# Patient Record
Sex: Female | Born: 1945 | Race: White | Hispanic: No | Marital: Married | Smoking: Former smoker
Health system: Southern US, Community
[De-identification: ages and names within clinical notes are randomized; demographics above are authoritative.]

## PROBLEM LIST (undated history)

## (undated) DIAGNOSIS — I10 Essential (primary) hypertension: Secondary | ICD-10-CM

## (undated) DIAGNOSIS — L219 Seborrheic dermatitis, unspecified: Secondary | ICD-10-CM

## (undated) DIAGNOSIS — M8580 Other specified disorders of bone density and structure, unspecified site: Secondary | ICD-10-CM

## (undated) DIAGNOSIS — E042 Nontoxic multinodular goiter: Secondary | ICD-10-CM

## (undated) HISTORY — DX: Essential (primary) hypertension: I10

## (undated) HISTORY — DX: Other specified disorders of bone density and structure, unspecified site: M85.80

## (undated) HISTORY — DX: Seborrheic dermatitis, unspecified: L21.9

## (undated) HISTORY — DX: Nontoxic multinodular goiter: E04.2

## (undated) HISTORY — PX: CATARACT EXTRACTION: SUR2

---

## 1974-04-16 HISTORY — PX: TUMOR REMOVAL: SHX12

## 1999-04-17 HISTORY — PX: BACK SURGERY: SHX140

## 2000-09-05 ENCOUNTER — Encounter: Admission: RE | Admit: 2000-09-05 | Discharge: 2000-09-05 | Payer: Self-pay | Admitting: Family Medicine

## 2000-09-05 ENCOUNTER — Encounter: Payer: Self-pay | Admitting: Family Medicine

## 2000-09-16 ENCOUNTER — Encounter: Payer: Self-pay | Admitting: Neurological Surgery

## 2000-09-16 ENCOUNTER — Ambulatory Visit (HOSPITAL_COMMUNITY): Admission: RE | Admit: 2000-09-16 | Discharge: 2000-09-17 | Payer: Self-pay | Admitting: Neurological Surgery

## 2001-07-15 ENCOUNTER — Encounter: Admission: RE | Admit: 2001-07-15 | Discharge: 2001-10-13 | Payer: Self-pay | Admitting: Neurological Surgery

## 2002-10-20 ENCOUNTER — Encounter: Payer: Self-pay | Admitting: *Deleted

## 2002-10-20 ENCOUNTER — Encounter: Admission: RE | Admit: 2002-10-20 | Discharge: 2002-10-20 | Payer: Self-pay | Admitting: *Deleted

## 2003-09-27 ENCOUNTER — Other Ambulatory Visit: Admission: RE | Admit: 2003-09-27 | Discharge: 2003-09-27 | Payer: Self-pay | Admitting: *Deleted

## 2003-12-30 ENCOUNTER — Encounter: Admission: RE | Admit: 2003-12-30 | Discharge: 2003-12-30 | Payer: Self-pay | Admitting: *Deleted

## 2005-04-16 HISTORY — PX: LASIK: SHX215

## 2005-12-13 ENCOUNTER — Other Ambulatory Visit: Admission: RE | Admit: 2005-12-13 | Discharge: 2005-12-13 | Payer: Self-pay | Admitting: *Deleted

## 2007-05-13 ENCOUNTER — Other Ambulatory Visit: Admission: RE | Admit: 2007-05-13 | Discharge: 2007-05-13 | Payer: Self-pay | Admitting: Family Medicine

## 2007-05-14 ENCOUNTER — Encounter: Admission: RE | Admit: 2007-05-14 | Discharge: 2007-05-14 | Payer: Self-pay | Admitting: Family Medicine

## 2008-07-08 ENCOUNTER — Other Ambulatory Visit: Admission: RE | Admit: 2008-07-08 | Discharge: 2008-07-08 | Payer: Self-pay | Admitting: Family Medicine

## 2009-10-10 ENCOUNTER — Encounter: Admission: RE | Admit: 2009-10-10 | Discharge: 2009-10-10 | Payer: Self-pay | Admitting: Family Medicine

## 2010-09-01 NOTE — Op Note (Signed)
[place]  Patient:    Caitlin Marshall, Caitlin Marshall                       MRN: [id number] Proc. Date: 09/16/00 Adm. Date:  [id number] Disc. 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  OPERATION PERFORMED:  Microendoscopic diskectomy L5-S1, left with microdissection technique and operating microscope.  SURGEON:  Stefani Dama, M.D.  ANESTHESIA:  General endotracheal.  INDICATIONS FOR PROCEDURE:  The patient is a 65 year old individual who has had significant back and left lower extremity pain with severe and unrelenting lumbar radiculopathy.  She has weakness in the dorsiflexors on the left side. She has failed efforts at conservative management because of the weakness is taken to the operating room.  DESCRIPTION OF PROCEDURE:  The patient was brought to the operating room supine on the stretcher.  After smooth induction of general endotracheal anesthesia, she was placed on the table in the prone position with the table in the jackknife position.  Bony prominences were appropriately padded and protected.  Then the fluoroscopy unit was draped and after prepping it with DuraPrep and draping it in sterile fashion, fluoroscopic localization of L5-S1 was obtained.  The skin was marked appropriately, the area of skin infiltrated on the left paramedian region and then a stab incision was made so that a K-wire could be passed down to the pars area of the L5 vertebrae.  Then using a wanding technique, a series of dilators was passed over this initial K-wire dilating it to the 18 mm diameter.  Then an 18 mm x 5 cm endoscopic cannula was placed and affixed to the bedframe.  The soft tissues were cleared  down at the depths and lateral margin of the pars was identified.  The superior margin of the facet of L5-S1 on the left side was also identified.  An Anspach drill with a 2.3 mm dissecting tool was then used to clear the lateral portion of the pars and the superior portion of the facet joint.  With this being removed, the fascia underlying it was opened using a series of punches and the underlying nerve root was noted to be bowed dorsally.  Then by gently dissecting the nerve cephalad, the underlying space was examined.  This was noted to be an intact but bowed ligament.  The ligament was incised and underneath this was removed several fragments of disk material.  This immediately gave good decompression for the lateral aspect of the L5 nerve root.  With this the area was sounded further and no other fragments of disk could be found.  The L5 nerve root thus being decompressed, hemostasis in the soft tissues was doubly checked.  The area was copiously irrigated with antibiotic irrigating solution and then the endoscopic cannula was removed and the lumbodorsal fascia was closed with #3 Vicryl subcutaneously and subcuticularly.  The patient tolerated the procedure well.  Blood loss was estimated at less than 50 cc. DD:  09/16/00 TD:  09/17/00 Job: [id number]

## 2010-11-09 ENCOUNTER — Other Ambulatory Visit (HOSPITAL_COMMUNITY)
Admission: RE | Admit: 2010-11-09 | Discharge: 2010-11-09 | Disposition: A | Discharge status: Home / Self Care | Payer: BC Managed Care – PPO | Source: Ambulatory Visit | Attending: Family Medicine | Admitting: Family Medicine

## 2010-11-09 ENCOUNTER — Other Ambulatory Visit: Payer: Self-pay | Admitting: Family Medicine

## 2010-11-09 DIAGNOSIS — Z0000 Encounter for general adult medical examination without abnormal findings: Secondary | ICD-10-CM | POA: Insufficient documentation

## 2012-10-31 ENCOUNTER — Other Ambulatory Visit: Payer: Self-pay

## 2012-10-31 DIAGNOSIS — Z1231 Encounter for screening mammogram for malignant neoplasm of breast: Secondary | ICD-10-CM

## 2012-11-25 ENCOUNTER — Ambulatory Visit: Payer: BC Managed Care – PPO

## 2012-12-09 ENCOUNTER — Ambulatory Visit: Payer: BC Managed Care – PPO

## 2012-12-25 ENCOUNTER — Ambulatory Visit: Payer: BC Managed Care – PPO

## 2012-12-26 ENCOUNTER — Ambulatory Visit
Admission: RE | Admit: 2012-12-26 | Discharge: 2012-12-26 | Disposition: A | Discharge status: Home / Self Care | Payer: Medicare Other | Source: Ambulatory Visit

## 2012-12-26 DIAGNOSIS — Z1231 Encounter for screening mammogram for malignant neoplasm of breast: Secondary | ICD-10-CM

## 2012-12-29 ENCOUNTER — Other Ambulatory Visit: Payer: Self-pay | Admitting: Family Medicine

## 2012-12-29 DIAGNOSIS — R928 Other abnormal and inconclusive findings on diagnostic imaging of breast: Secondary | ICD-10-CM

## 2013-01-01 ENCOUNTER — Ambulatory Visit
Admission: RE | Admit: 2013-01-01 | Discharge: 2013-01-01 | Disposition: A | Discharge status: Home / Self Care | Payer: Medicare Other | Source: Ambulatory Visit | Attending: Family Medicine | Admitting: Family Medicine

## 2013-01-01 DIAGNOSIS — R928 Other abnormal and inconclusive findings on diagnostic imaging of breast: Secondary | ICD-10-CM

## 2013-01-13 ENCOUNTER — Other Ambulatory Visit: Payer: Medicare Other

## 2013-07-14 ENCOUNTER — Other Ambulatory Visit: Payer: Self-pay | Admitting: Family Medicine

## 2013-07-14 DIAGNOSIS — E041 Nontoxic single thyroid nodule: Secondary | ICD-10-CM

## 2013-07-16 ENCOUNTER — Ambulatory Visit
Admission: RE | Admit: 2013-07-16 | Discharge: 2013-07-16 | Disposition: A | Discharge status: Home / Self Care | Payer: 59 | Source: Ambulatory Visit | Attending: Family Medicine | Admitting: Family Medicine

## 2013-07-16 DIAGNOSIS — E041 Nontoxic single thyroid nodule: Secondary | ICD-10-CM

## 2013-07-21 ENCOUNTER — Other Ambulatory Visit: Payer: Self-pay | Admitting: Family Medicine

## 2013-07-21 DIAGNOSIS — E041 Nontoxic single thyroid nodule: Secondary | ICD-10-CM

## 2013-07-28 ENCOUNTER — Ambulatory Visit
Admission: RE | Admit: 2013-07-28 | Discharge: 2013-07-28 | Disposition: A | Discharge status: Home / Self Care | Payer: 59 | Source: Ambulatory Visit | Attending: Family Medicine | Admitting: Family Medicine

## 2013-07-28 ENCOUNTER — Other Ambulatory Visit (HOSPITAL_COMMUNITY)
Admission: RE | Admit: 2013-07-28 | Discharge: 2013-07-28 | Disposition: A | Discharge status: Home / Self Care | Payer: Medicare Other | Source: Ambulatory Visit | Attending: Interventional Radiology | Admitting: Interventional Radiology

## 2013-07-28 DIAGNOSIS — E041 Nontoxic single thyroid nodule: Secondary | ICD-10-CM | POA: Insufficient documentation

## 2013-12-25 ENCOUNTER — Other Ambulatory Visit: Payer: Self-pay

## 2013-12-25 DIAGNOSIS — Z1231 Encounter for screening mammogram for malignant neoplasm of breast: Secondary | ICD-10-CM

## 2014-01-12 ENCOUNTER — Ambulatory Visit
Admission: RE | Admit: 2014-01-12 | Discharge: 2014-01-12 | Disposition: A | Discharge status: Home / Self Care | Payer: 59 | Source: Ambulatory Visit

## 2014-01-12 DIAGNOSIS — Z1231 Encounter for screening mammogram for malignant neoplasm of breast: Secondary | ICD-10-CM

## 2014-01-13 ENCOUNTER — Other Ambulatory Visit: Payer: Self-pay | Admitting: Family Medicine

## 2014-01-13 ENCOUNTER — Other Ambulatory Visit (HOSPITAL_COMMUNITY)
Admission: RE | Admit: 2014-01-13 | Discharge: 2014-01-13 | Disposition: A | Discharge status: Home / Self Care | Payer: 59 | Source: Ambulatory Visit | Attending: Family Medicine | Admitting: Family Medicine

## 2014-01-13 DIAGNOSIS — Z124 Encounter for screening for malignant neoplasm of cervix: Secondary | ICD-10-CM | POA: Insufficient documentation

## 2014-01-14 LAB — CYTOLOGY - PAP

## 2014-07-08 ENCOUNTER — Other Ambulatory Visit: Payer: Self-pay | Admitting: Family Medicine

## 2014-07-08 DIAGNOSIS — E041 Nontoxic single thyroid nodule: Secondary | ICD-10-CM

## 2014-08-02 ENCOUNTER — Ambulatory Visit
Admission: RE | Admit: 2014-08-02 | Discharge: 2014-08-02 | Disposition: A | Discharge status: Home / Self Care | Payer: Medicare Other | Source: Ambulatory Visit | Attending: Family Medicine | Admitting: Family Medicine

## 2014-08-02 DIAGNOSIS — E041 Nontoxic single thyroid nodule: Secondary | ICD-10-CM

## 2015-01-21 ENCOUNTER — Other Ambulatory Visit: Payer: Self-pay

## 2015-01-21 DIAGNOSIS — Z1231 Encounter for screening mammogram for malignant neoplasm of breast: Secondary | ICD-10-CM

## 2015-02-16 ENCOUNTER — Ambulatory Visit
Admission: RE | Admit: 2015-02-16 | Discharge: 2015-02-16 | Disposition: A | Discharge status: Home / Self Care | Payer: Medicare Other | Source: Ambulatory Visit

## 2015-02-16 DIAGNOSIS — Z1231 Encounter for screening mammogram for malignant neoplasm of breast: Secondary | ICD-10-CM

## 2015-07-14 ENCOUNTER — Other Ambulatory Visit: Payer: Self-pay | Admitting: Family Medicine

## 2015-07-14 DIAGNOSIS — E041 Nontoxic single thyroid nodule: Secondary | ICD-10-CM

## 2015-08-03 ENCOUNTER — Ambulatory Visit
Admission: RE | Admit: 2015-08-03 | Discharge: 2015-08-03 | Disposition: A | Discharge status: Home / Self Care | Payer: Medicare Other | Source: Ambulatory Visit | Attending: Family Medicine | Admitting: Family Medicine

## 2015-08-03 DIAGNOSIS — E041 Nontoxic single thyroid nodule: Secondary | ICD-10-CM

## 2017-08-20 ENCOUNTER — Other Ambulatory Visit: Payer: Self-pay | Admitting: Family Medicine

## 2017-08-20 DIAGNOSIS — E041 Nontoxic single thyroid nodule: Secondary | ICD-10-CM

## 2017-08-26 ENCOUNTER — Ambulatory Visit
Admission: RE | Admit: 2017-08-26 | Discharge: 2017-08-26 | Disposition: A | Discharge status: Home / Self Care | Payer: Medicare Other | Source: Ambulatory Visit | Attending: Family Medicine | Admitting: Family Medicine

## 2017-08-26 DIAGNOSIS — E041 Nontoxic single thyroid nodule: Secondary | ICD-10-CM

## 2017-09-12 ENCOUNTER — Other Ambulatory Visit: Payer: Self-pay | Admitting: Family Medicine

## 2017-09-12 DIAGNOSIS — Z1231 Encounter for screening mammogram for malignant neoplasm of breast: Secondary | ICD-10-CM

## 2017-10-02 ENCOUNTER — Ambulatory Visit
Admission: RE | Admit: 2017-10-02 | Discharge: 2017-10-02 | Disposition: A | Discharge status: Home / Self Care | Payer: Medicare Other | Source: Ambulatory Visit | Attending: Family Medicine | Admitting: Family Medicine

## 2017-10-02 DIAGNOSIS — Z1231 Encounter for screening mammogram for malignant neoplasm of breast: Secondary | ICD-10-CM

## 2018-03-28 ENCOUNTER — Other Ambulatory Visit: Payer: Self-pay | Admitting: Family Medicine

## 2018-03-28 DIAGNOSIS — M8580 Other specified disorders of bone density and structure, unspecified site: Secondary | ICD-10-CM

## 2018-07-08 ENCOUNTER — Other Ambulatory Visit: Payer: Medicare Other

## 2018-08-29 ENCOUNTER — Other Ambulatory Visit: Payer: Medicare Other

## 2018-10-20 ENCOUNTER — Other Ambulatory Visit: Payer: Self-pay

## 2018-10-20 ENCOUNTER — Ambulatory Visit
Admission: RE | Admit: 2018-10-20 | Discharge: 2018-10-20 | Disposition: A | Discharge status: Home / Self Care | Payer: Medicare Other | Source: Ambulatory Visit | Attending: Family Medicine | Admitting: Family Medicine

## 2018-10-20 DIAGNOSIS — M8580 Other specified disorders of bone density and structure, unspecified site: Secondary | ICD-10-CM

## 2019-05-05 ENCOUNTER — Ambulatory Visit: Payer: Medicare PPO | Attending: Internal Medicine

## 2019-05-05 DIAGNOSIS — Z23 Encounter for immunization: Secondary | ICD-10-CM | POA: Insufficient documentation

## 2019-05-05 NOTE — Progress Notes (Signed)
   Covid-19 Vaccination Clinic  Name:  NEDDIE STEEDMAN    MRN: [id number] DOB: 12/02/1945  05/05/2019  Ms. Candelaria was observed post Covid-19 immunization for 15 minutes without incidence. She was provided with Vaccine Information Sheet and instruction to access the V-Safe system.   Ms. Strehle was instructed to call 911 with any severe reactions post vaccine: Marland Kitchen Difficulty breathing  . Swelling of your face and throat  . A fast heartbeat  . A bad rash all over your body  . Dizziness and weakness    Immunizations Administered    Name Date Dose VIS Date Route   Pfizer COVID-19 Vaccine 05/05/2019  3:46 PM 0.3 mL 03/27/2019 Intramuscular   Manufacturer: [employer], [employer]   Lot: [id number]   NDC: [address]-9

## 2019-05-25 ENCOUNTER — Ambulatory Visit: Payer: Medicare PPO | Attending: Internal Medicine

## 2019-05-25 DIAGNOSIS — Z23 Encounter for immunization: Secondary | ICD-10-CM | POA: Insufficient documentation

## 2019-05-25 NOTE — Progress Notes (Signed)
   Covid-19 Vaccination Clinic  Name:  EVANGALINE JOU    MRN: [id number] DOB: January 17, 1946  05/25/2019  Ms. Tzeng was observed post Covid-19 immunization for 15 minutes without incidence. She was provided with Vaccine Information Sheet and instruction to access the V-Safe system.   Ms. Fritzler was instructed to call 911 with any severe reactions post vaccine: Marland Kitchen Difficulty breathing  . Swelling of your face and throat  . A fast heartbeat  . A bad rash all over your body  . Dizziness and weakness    Immunizations Administered    Name Date Dose VIS Date Route   Pfizer COVID-19 Vaccine 05/25/2019  9:04 AM 0.3 mL 03/27/2019 Intramuscular   Manufacturer: [employer], [employer]   Lot: [id number]   NDC: [address]-1

## 2019-12-08 DIAGNOSIS — H353131 Nonexudative age-related macular degeneration, bilateral, early dry stage: Secondary | ICD-10-CM | POA: Diagnosis not present

## 2019-12-08 DIAGNOSIS — H524 Presbyopia: Secondary | ICD-10-CM | POA: Diagnosis not present

## 2019-12-08 DIAGNOSIS — Z961 Presence of intraocular lens: Secondary | ICD-10-CM | POA: Diagnosis not present

## 2019-12-29 ENCOUNTER — Ambulatory Visit: Payer: Medicare PPO | Attending: Internal Medicine

## 2019-12-29 DIAGNOSIS — Z23 Encounter for immunization: Secondary | ICD-10-CM

## 2019-12-29 NOTE — Progress Notes (Signed)
   Covid-19 Vaccination Clinic  Name:  Caitlin Marshall    MRN: [id number] DOB: 22-Dec-1945  12/29/2019  Caitlin Marshall was observed post Covid-19 immunization for 15 minutes without incident. She was provided with Vaccine Information Sheet and instruction to access the V-Safe system.   Caitlin Marshall was instructed to call 911 with any severe reactions post vaccine: Marland Kitchen Difficulty breathing  . Swelling of face and throat  . A fast heartbeat  . A bad rash all over body  . Dizziness and weakness

## 2020-04-01 DIAGNOSIS — Z1389 Encounter for screening for other disorder: Secondary | ICD-10-CM | POA: Diagnosis not present

## 2020-04-01 DIAGNOSIS — I10 Essential (primary) hypertension: Secondary | ICD-10-CM | POA: Diagnosis not present

## 2020-04-01 DIAGNOSIS — Z1159 Encounter for screening for other viral diseases: Secondary | ICD-10-CM | POA: Diagnosis not present

## 2020-04-01 DIAGNOSIS — Z0000 Encounter for general adult medical examination without abnormal findings: Secondary | ICD-10-CM | POA: Diagnosis not present

## 2020-07-01 DIAGNOSIS — D3709 Neoplasm of uncertain behavior of other specified sites of the oral cavity: Secondary | ICD-10-CM | POA: Diagnosis not present

## 2020-07-01 DIAGNOSIS — K136 Irritative hyperplasia of oral mucosa: Secondary | ICD-10-CM | POA: Diagnosis not present

## 2020-07-07 ENCOUNTER — Other Ambulatory Visit: Payer: Self-pay | Admitting: Family Medicine

## 2020-07-07 DIAGNOSIS — Z1231 Encounter for screening mammogram for malignant neoplasm of breast: Secondary | ICD-10-CM

## 2020-08-30 ENCOUNTER — Ambulatory Visit
Admission: RE | Admit: 2020-08-30 | Discharge: 2020-08-30 | Disposition: A | Discharge status: Home / Self Care | Payer: Medicare PPO | Source: Ambulatory Visit | Attending: Family Medicine | Admitting: Family Medicine

## 2020-08-30 ENCOUNTER — Other Ambulatory Visit: Payer: Self-pay

## 2020-08-30 DIAGNOSIS — Z1231 Encounter for screening mammogram for malignant neoplasm of breast: Secondary | ICD-10-CM

## 2020-09-01 DIAGNOSIS — H353132 Nonexudative age-related macular degeneration, bilateral, intermediate dry stage: Secondary | ICD-10-CM | POA: Diagnosis not present

## 2020-09-01 DIAGNOSIS — Z961 Presence of intraocular lens: Secondary | ICD-10-CM | POA: Diagnosis not present

## 2020-09-01 DIAGNOSIS — H524 Presbyopia: Secondary | ICD-10-CM | POA: Diagnosis not present

## 2020-09-07 DIAGNOSIS — M170 Bilateral primary osteoarthritis of knee: Secondary | ICD-10-CM | POA: Diagnosis not present

## 2020-09-20 DIAGNOSIS — D225 Melanocytic nevi of trunk: Secondary | ICD-10-CM | POA: Diagnosis not present

## 2020-09-20 DIAGNOSIS — L820 Inflamed seborrheic keratosis: Secondary | ICD-10-CM | POA: Diagnosis not present

## 2020-09-20 DIAGNOSIS — L218 Other seborrheic dermatitis: Secondary | ICD-10-CM | POA: Diagnosis not present

## 2020-10-05 DIAGNOSIS — M1711 Unilateral primary osteoarthritis, right knee: Secondary | ICD-10-CM | POA: Diagnosis not present

## 2020-10-05 DIAGNOSIS — M25561 Pain in right knee: Secondary | ICD-10-CM | POA: Diagnosis not present

## 2020-10-12 DIAGNOSIS — M1711 Unilateral primary osteoarthritis, right knee: Secondary | ICD-10-CM | POA: Diagnosis not present

## 2020-10-19 DIAGNOSIS — M25561 Pain in right knee: Secondary | ICD-10-CM | POA: Diagnosis not present

## 2020-10-19 DIAGNOSIS — M1711 Unilateral primary osteoarthritis, right knee: Secondary | ICD-10-CM | POA: Diagnosis not present

## 2020-10-28 DIAGNOSIS — L989 Disorder of the skin and subcutaneous tissue, unspecified: Secondary | ICD-10-CM | POA: Diagnosis not present

## 2020-10-28 DIAGNOSIS — I10 Essential (primary) hypertension: Secondary | ICD-10-CM | POA: Diagnosis not present

## 2020-10-28 DIAGNOSIS — L299 Pruritus, unspecified: Secondary | ICD-10-CM | POA: Diagnosis not present

## 2021-01-01 DIAGNOSIS — Z20822 Contact with and (suspected) exposure to covid-19: Secondary | ICD-10-CM | POA: Diagnosis not present

## 2021-01-01 DIAGNOSIS — U071 COVID-19: Secondary | ICD-10-CM | POA: Diagnosis not present

## 2021-05-04 DIAGNOSIS — Z86010 Personal history of colonic polyps: Secondary | ICD-10-CM | POA: Diagnosis not present

## 2021-05-04 DIAGNOSIS — Z0000 Encounter for general adult medical examination without abnormal findings: Secondary | ICD-10-CM | POA: Diagnosis not present

## 2021-05-04 DIAGNOSIS — E041 Nontoxic single thyroid nodule: Secondary | ICD-10-CM | POA: Diagnosis not present

## 2021-05-04 DIAGNOSIS — M8580 Other specified disorders of bone density and structure, unspecified site: Secondary | ICD-10-CM | POA: Diagnosis not present

## 2021-05-04 DIAGNOSIS — Z23 Encounter for immunization: Secondary | ICD-10-CM | POA: Diagnosis not present

## 2021-05-04 DIAGNOSIS — I10 Essential (primary) hypertension: Secondary | ICD-10-CM | POA: Diagnosis not present

## 2021-05-05 ENCOUNTER — Other Ambulatory Visit: Payer: Self-pay | Admitting: Family Medicine

## 2021-05-05 DIAGNOSIS — E041 Nontoxic single thyroid nodule: Secondary | ICD-10-CM

## 2021-05-10 DIAGNOSIS — Z961 Presence of intraocular lens: Secondary | ICD-10-CM | POA: Diagnosis not present

## 2021-05-10 DIAGNOSIS — H353132 Nonexudative age-related macular degeneration, bilateral, intermediate dry stage: Secondary | ICD-10-CM | POA: Diagnosis not present

## 2021-05-10 DIAGNOSIS — H26492 Other secondary cataract, left eye: Secondary | ICD-10-CM | POA: Diagnosis not present

## 2021-06-29 DIAGNOSIS — H26492 Other secondary cataract, left eye: Secondary | ICD-10-CM | POA: Diagnosis not present

## 2021-07-25 ENCOUNTER — Ambulatory Visit
Admission: RE | Admit: 2021-07-25 | Discharge: 2021-07-25 | Disposition: A | Discharge status: Home / Self Care | Payer: Medicare PPO | Source: Ambulatory Visit | Attending: Family Medicine | Admitting: Family Medicine

## 2021-07-25 DIAGNOSIS — E041 Nontoxic single thyroid nodule: Secondary | ICD-10-CM

## 2021-08-15 DIAGNOSIS — D225 Melanocytic nevi of trunk: Secondary | ICD-10-CM | POA: Diagnosis not present

## 2021-08-15 DIAGNOSIS — L821 Other seborrheic keratosis: Secondary | ICD-10-CM | POA: Diagnosis not present

## 2021-08-15 DIAGNOSIS — B0089 Other herpesviral infection: Secondary | ICD-10-CM | POA: Diagnosis not present

## 2021-09-07 DIAGNOSIS — Z961 Presence of intraocular lens: Secondary | ICD-10-CM | POA: Diagnosis not present

## 2021-09-07 DIAGNOSIS — H353132 Nonexudative age-related macular degeneration, bilateral, intermediate dry stage: Secondary | ICD-10-CM | POA: Diagnosis not present

## 2021-09-22 DIAGNOSIS — M1711 Unilateral primary osteoarthritis, right knee: Secondary | ICD-10-CM | POA: Diagnosis not present

## 2021-09-26 DIAGNOSIS — Z86010 Personal history of colonic polyps: Secondary | ICD-10-CM | POA: Diagnosis not present

## 2021-10-04 DIAGNOSIS — Z09 Encounter for follow-up examination after completed treatment for conditions other than malignant neoplasm: Secondary | ICD-10-CM | POA: Diagnosis not present

## 2021-10-04 DIAGNOSIS — Z86010 Personal history of colonic polyps: Secondary | ICD-10-CM | POA: Diagnosis not present

## 2021-10-24 ENCOUNTER — Other Ambulatory Visit: Payer: Self-pay | Admitting: Family Medicine

## 2021-10-24 DIAGNOSIS — Z1231 Encounter for screening mammogram for malignant neoplasm of breast: Secondary | ICD-10-CM

## 2021-10-26 DIAGNOSIS — M25572 Pain in left ankle and joints of left foot: Secondary | ICD-10-CM | POA: Diagnosis not present

## 2021-11-07 DIAGNOSIS — I10 Essential (primary) hypertension: Secondary | ICD-10-CM | POA: Diagnosis not present

## 2021-11-07 DIAGNOSIS — R001 Bradycardia, unspecified: Secondary | ICD-10-CM | POA: Diagnosis not present

## 2021-11-07 DIAGNOSIS — M1711 Unilateral primary osteoarthritis, right knee: Secondary | ICD-10-CM | POA: Diagnosis not present

## 2021-11-14 ENCOUNTER — Ambulatory Visit
Admission: RE | Admit: 2021-11-14 | Discharge: 2021-11-14 | Disposition: A | Discharge status: Home / Self Care | Payer: Medicare PPO | Source: Ambulatory Visit | Attending: Family Medicine | Admitting: Family Medicine

## 2021-11-14 DIAGNOSIS — Z1231 Encounter for screening mammogram for malignant neoplasm of breast: Secondary | ICD-10-CM

## 2021-11-27 ENCOUNTER — Ambulatory Visit (INDEPENDENT_AMBULATORY_CARE_PROVIDER_SITE_OTHER): Payer: Medicare PPO | Admitting: Cardiology

## 2021-11-27 ENCOUNTER — Other Ambulatory Visit: Payer: Self-pay

## 2021-11-27 VITALS — BP 128/72 | HR 60 | Ht 67.0 in | Wt 168.0 lb

## 2021-11-27 DIAGNOSIS — R001 Bradycardia, unspecified: Secondary | ICD-10-CM

## 2021-11-27 NOTE — Progress Notes (Signed)
Electrophysiology Office Note   Date:  11/27/2021   ID:  Caitlin Marshall, DOB 01-09-1946, MRN [id number]  PCP:  Laurann Montana, MD  Cardiologist:   Primary Electrophysiologist:  Naliya Gish Jorja Loa, MD    Chief Complaint: bradycardia   History of Present Illness: Caitlin Marshall is a 76 y.o. female who is being seen today for the evaluation of bradycardia at the request of Laurann Montana, MD. Presenting today for electrophysiology evaluation.  She has a history significant for hypertension.  She was getting a colonoscopy recently, and during the procedure apparently became bradycardic.  She was told that they had a hard time getting her heart rate to speed up.  She has not had any issues previously with this.  She has been always able to do all of her daily activities.  She is hampered with knee pain and has an upcoming injection scheduled.  After her injection, she expects to be able to get back to exercising without issue.  She is able to ambulate and climb stairs, and has no symptoms of bradycardia.  She does state that her heart rate at times is in the 50s, but she is asymptomatic from this.  Today, she denies symptoms of palpitations, chest pain, shortness of breath, orthopnea, PND, lower extremity edema, claudication, dizziness, presyncope, syncope, bleeding, or neurologic sequela. The patient is tolerating medications without difficulties.    Past Medical History:  Diagnosis Date   Hypertension    Multinodular goiter    Osteopenia    Seborrheic dermatitis    Past Surgical History:  Procedure Laterality Date   BACK SURGERY  2001   lumbar   CATARACT EXTRACTION Bilateral    5/19   LASIK  2007   TUMOR REMOVAL  1976   superficial - left jaw - benign     Current Outpatient Medications  Medication Sig Dispense Refill   ascorbic acid (VITAMIN C) 500 MG tablet Take 1 tablet by mouth daily.     Calcium Citrate-Vitamin D (CALCIUM CITRATE + PO) Take 1 tablet by mouth daily.      Coenzyme Q10 (CO Q 10) 100 MG CAPS as directed.     GLUCOSAMINE-CHONDROITIN PO Take 1 Capful by mouth daily.     irbesartan (AVAPRO) 300 MG tablet Take 300 mg by mouth daily.     Multiple Vitamin (MULTIVITAMIN PO) Take 1 tablet by mouth daily.     Multiple Vitamins-Minerals (PRESERVISION AREDS PO) Take 1 tablet by mouth daily.     Probiotic Product (ALIGN) 4 MG CAPS as directed.     Zinc 50 MG TABS Take 1 tablet by mouth daily.     No current facility-administered medications for this visit.    Allergies:   Codeine and Sulfa antibiotics   Social History:  The patient  reports that she quit smoking about 53 years ago. Her smoking use included cigarettes. She has never used smokeless tobacco. She reports current alcohol use of about 3.0 standard drinks of alcohol per week. She reports that she does not use drugs.   Family History:  The patient's family history includes Cancer in her daughter; Leukemia in her mother; Thyroid disease in her mother; Valvular heart disease in her father.    ROS:  Please see the history of present illness.   Otherwise, review of systems is positive for none.   All other systems are reviewed and negative.    PHYSICAL EXAM: VS:  BP 128/72   Pulse 60   Ht 5\' 7"  (  1.702 m)   Wt 168 lb (76.2 kg)   SpO2 96%   BMI 26.31 kg/m  , BMI Body mass index is 26.31 kg/m. GEN: Well nourished, well developed, in no acute distress  HEENT: normal  Neck: no JVD, carotid bruits, or masses Cardiac: RRR; no murmurs, rubs, or gallops,no edema  Respiratory:  clear to auscultation bilaterally, normal work of breathing GI: soft, nontender, nondistended, + BS MS: no deformity or atrophy  Skin: warm and dry Neuro:  Strength and sensation are intact Psych: euthymic mood, full affect  EKG:  EKG is ordered today. Personal review of the ekg ordered shows sinus rhythm, rate 60  Recent Labs: No results found for requested labs within last 365 days.    Lipid Panel  No  results found for: "CHOL", "TRIG", "HDL", "CHOLHDL", "VLDL", "LDLCALC", "LDLDIRECT"   Wt Readings from Last 3 Encounters:  11/27/21 168 lb (76.2 kg)      Other studies Reviewed: Additional studies/ records that were reviewed today include: PCP notes  ASSESSMENT AND PLAN:  1.  Bradycardia: At this point it is unclear as to the cause of her bradycardia.  It could be that it is a issue with sedation and a vagal response.  That being said, her EKG is completely normal with normal intervals today.  We Shaylene Paganelli hold off on further testing for now.  We Mahek Schlesinger work to get the notes from her colonoscopy from [facility].  If that shows anything worrisome, we Tate Jerkins likely have her wear a monitor.    Current medicines are reviewed at length with the patient today.   The patient does not have concerns regarding her medicines.  The following changes were made today:  none  Labs/ tests ordered today include:  Orders Placed This Encounter  Procedures   EKG 12-Lead     Disposition:   FU with Oswaldo Cueto ending results from [facility]  Signed, Lan Entsminger Jorja Loa, MD  11/27/2021 12:40 PM     [facility] [address] [place] [address] [phone] (office) [phone] (fax)

## 2021-11-27 NOTE — Progress Notes (Signed)
Opened in error. Abstracting.

## 2021-11-27 NOTE — Patient Instructions (Signed)
Medication Instructions:  Your physician recommends that you continue on your current medications as directed. Please refer to the Current Medication list given to you today.  *If you need a refill on your cardiac medications before your next appointment, please call your pharmacy*   Lab Work: None ordered   Testing/Procedures: None ordered   Follow-Up: At [facility], you and your health needs are our priority.  As part of our continuing mission to provide you with exceptional heart care, we have created designated Provider Care Teams.  These Care Teams include your primary Cardiologist (physician) and Advanced Practice Providers (APPs -  Physician Assistants and Nurse Practitioners) who all work together to provide you with the care you need, when you need it.  Your next appointment:   To be  determined  The format for your next appointment:   In Person  Provider:   Loman Brooklyn, MD    Thank you for choosing [facility]!!   Dory Horn, RN [phone]  Other Instructions   Important Information About Sugar

## 2021-11-28 DIAGNOSIS — M1711 Unilateral primary osteoarthritis, right knee: Secondary | ICD-10-CM | POA: Diagnosis not present

## 2021-11-28 DIAGNOSIS — M25572 Pain in left ankle and joints of left foot: Secondary | ICD-10-CM | POA: Diagnosis not present

## 2021-12-05 DIAGNOSIS — M1711 Unilateral primary osteoarthritis, right knee: Secondary | ICD-10-CM | POA: Diagnosis not present

## 2021-12-15 DIAGNOSIS — M25572 Pain in left ankle and joints of left foot: Secondary | ICD-10-CM | POA: Diagnosis not present

## 2021-12-15 DIAGNOSIS — M1711 Unilateral primary osteoarthritis, right knee: Secondary | ICD-10-CM | POA: Diagnosis not present

## 2021-12-28 ENCOUNTER — Telehealth: Payer: Self-pay | Admitting: Cardiology

## 2021-12-28 NOTE — Telephone Encounter (Signed)
Left message informing office we have received colonoscopy report. Message will be forwarded to Dr. Gershon Crane nurse Roanna Raider to follow-up when she returns to the office.  Provided office number for callback if any questions.

## 2021-12-28 NOTE — Telephone Encounter (Signed)
  Velna Hatchet - Dr. Molly Maduro Buccini returning Sherri's call, she said, they don't have an EKG  for the pt but se did fax over pt's colonoscopy report showing pt's bradycardia

## 2021-12-29 DIAGNOSIS — M25572 Pain in left ankle and joints of left foot: Secondary | ICD-10-CM | POA: Diagnosis not present

## 2021-12-29 NOTE — Telephone Encounter (Signed)
Left message to call back  

## 2021-12-29 NOTE — Telephone Encounter (Signed)
Dr. Elberta Fortis,  Dr. Buccini's office states they do not have any EKGs on this pt.    Please advise...Marland KitchenMarland Kitchen

## 2022-01-01 NOTE — Telephone Encounter (Signed)
Pt is returning call. Requesting call back.  

## 2022-01-02 NOTE — Telephone Encounter (Signed)
Pt aware that EKG not available/done, but did explain that op notes reviewed by myself and Dr. Curt Bears, bradycardia noted during procedure. Advised to monitor going forward and we will see her PRN. Pt has been looking into purchasing an apple watch and will keep track that way. She will call the office if reoccurs. She appreciates my call

## 2022-01-08 DIAGNOSIS — M25572 Pain in left ankle and joints of left foot: Secondary | ICD-10-CM | POA: Diagnosis not present

## 2022-01-08 DIAGNOSIS — M19071 Primary osteoarthritis, right ankle and foot: Secondary | ICD-10-CM | POA: Diagnosis not present

## 2022-01-22 DIAGNOSIS — M79672 Pain in left foot: Secondary | ICD-10-CM | POA: Diagnosis not present

## 2022-01-22 DIAGNOSIS — M25572 Pain in left ankle and joints of left foot: Secondary | ICD-10-CM | POA: Diagnosis not present

## 2022-03-17 IMAGING — MG MM DIGITAL SCREENING BILAT W/ TOMO AND CAD
6 of 10 series · 6 of 30 positions shown · non-contrast
Comparison: Previous exam(s).

CLINICAL DATA: Screening.

EXAM:
DIGITAL SCREENING BILATERAL MAMMOGRAM WITH TOMOSYNTHESIS AND CAD
TECHNIQUE: Bilateral screening digital craniocaudal and mediolateral oblique
mammograms were obtained. Bilateral screening digital breast
tomosynthesis was performed. The images were evaluated with
computer-aided detection.

[R MLO synth-2D]
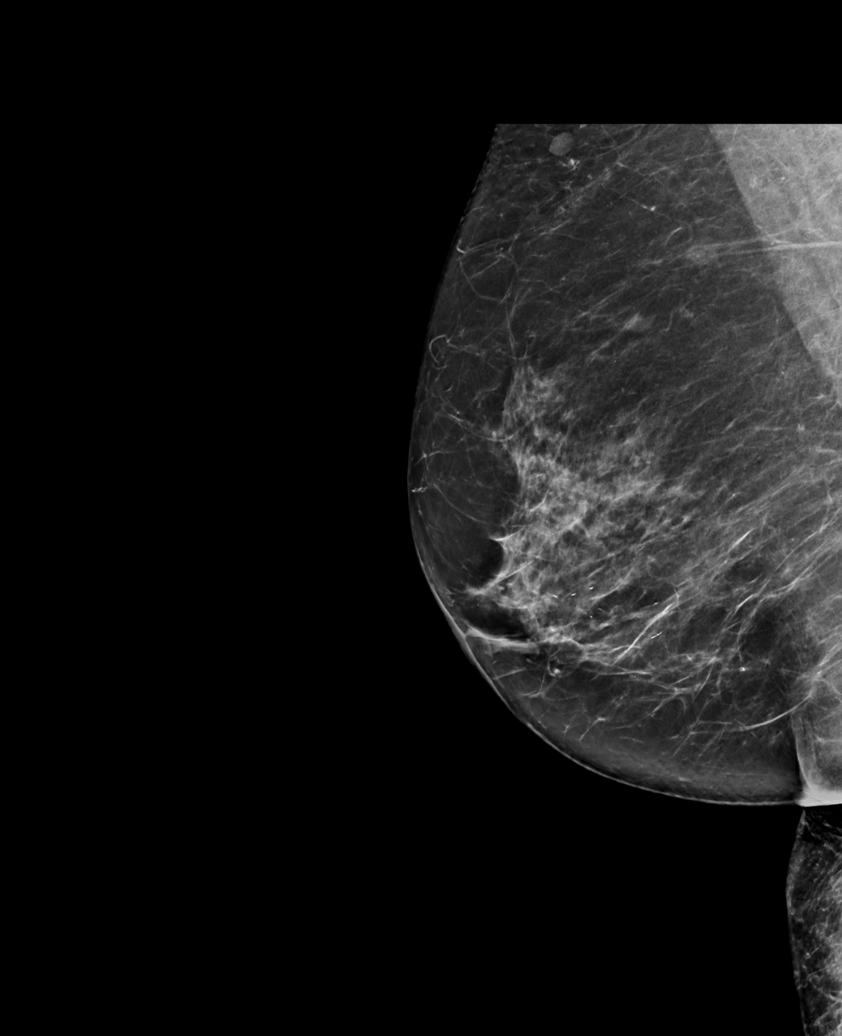

[R CC synth-2D (1 of 2)]
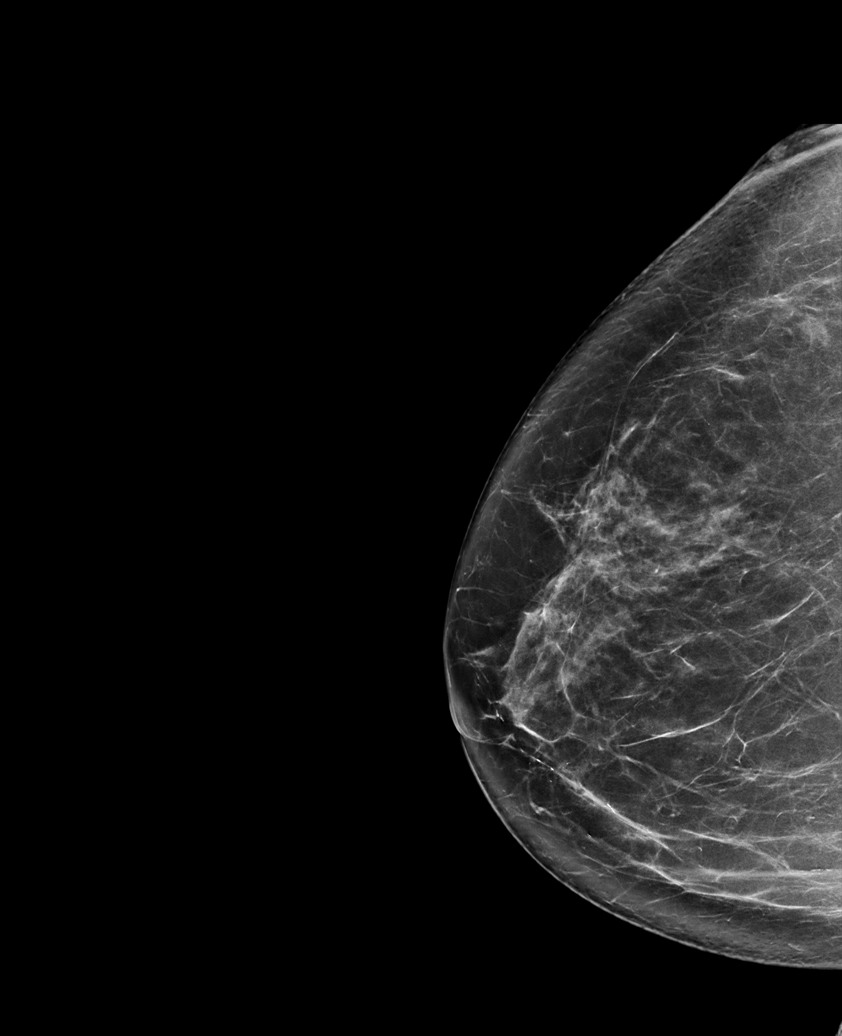

[L MLO synth-2D]
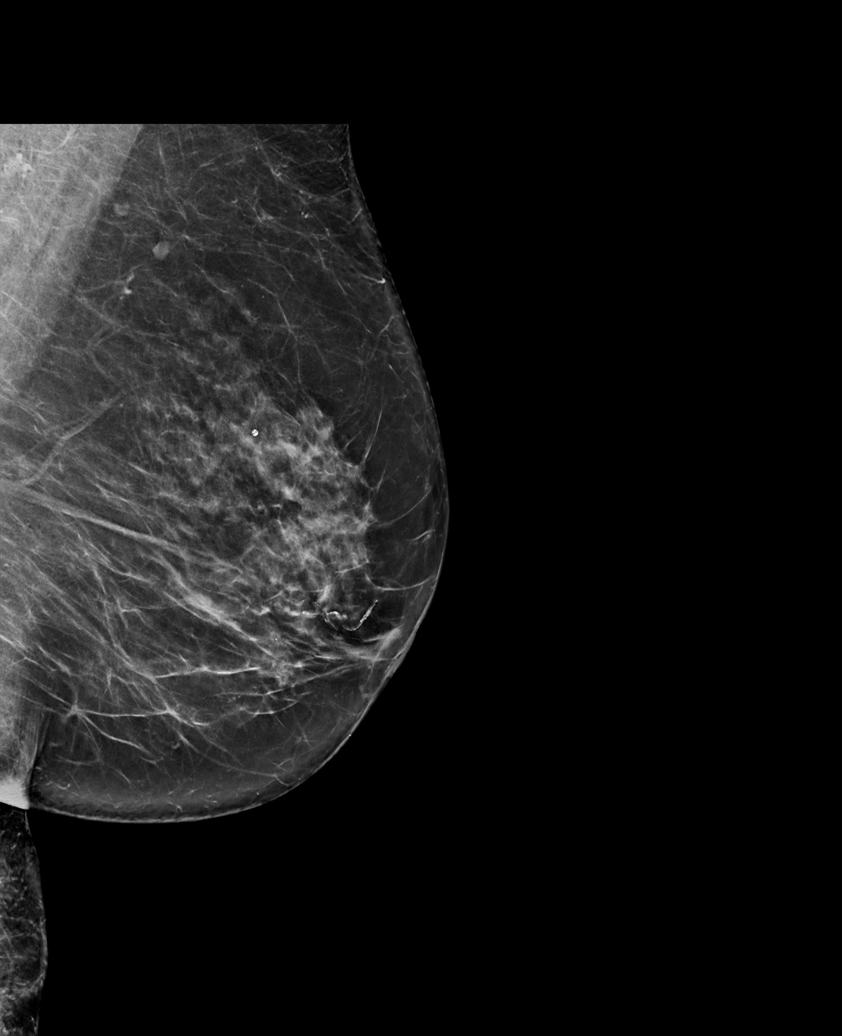

[R CC synth-2D (2 of 2)]
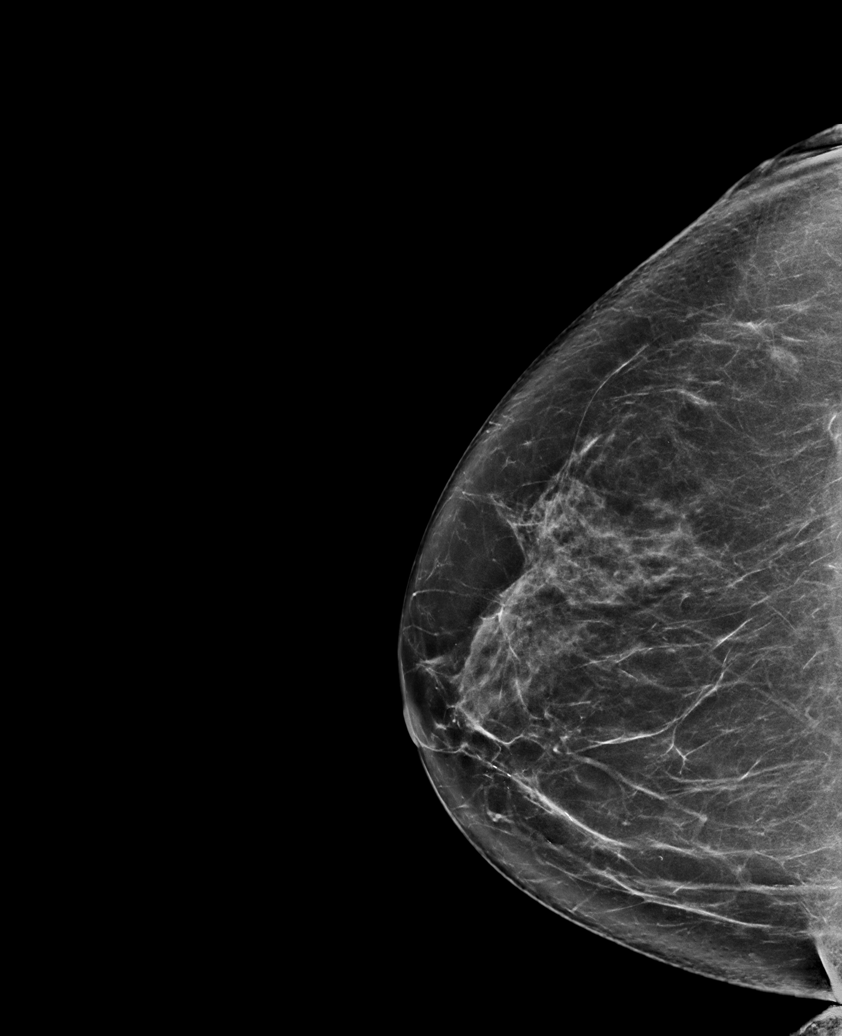

[L CC synth-2D]
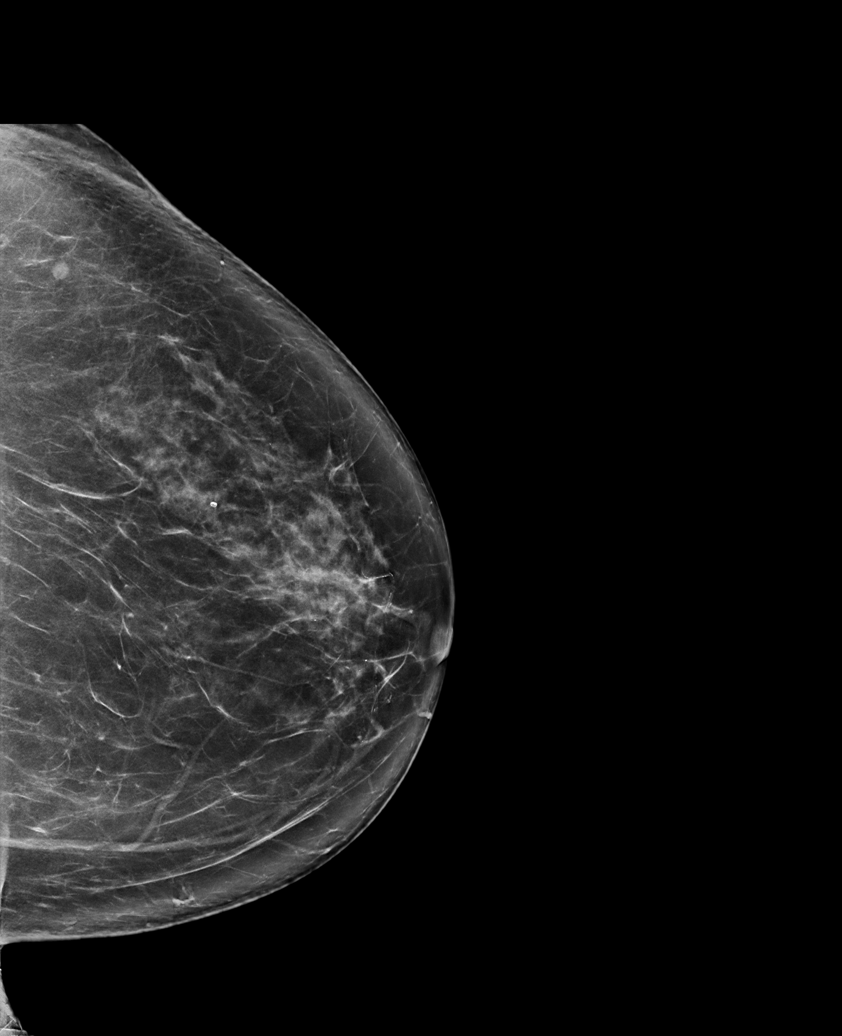

[R CC tomo · tomo slice 43/85.0]
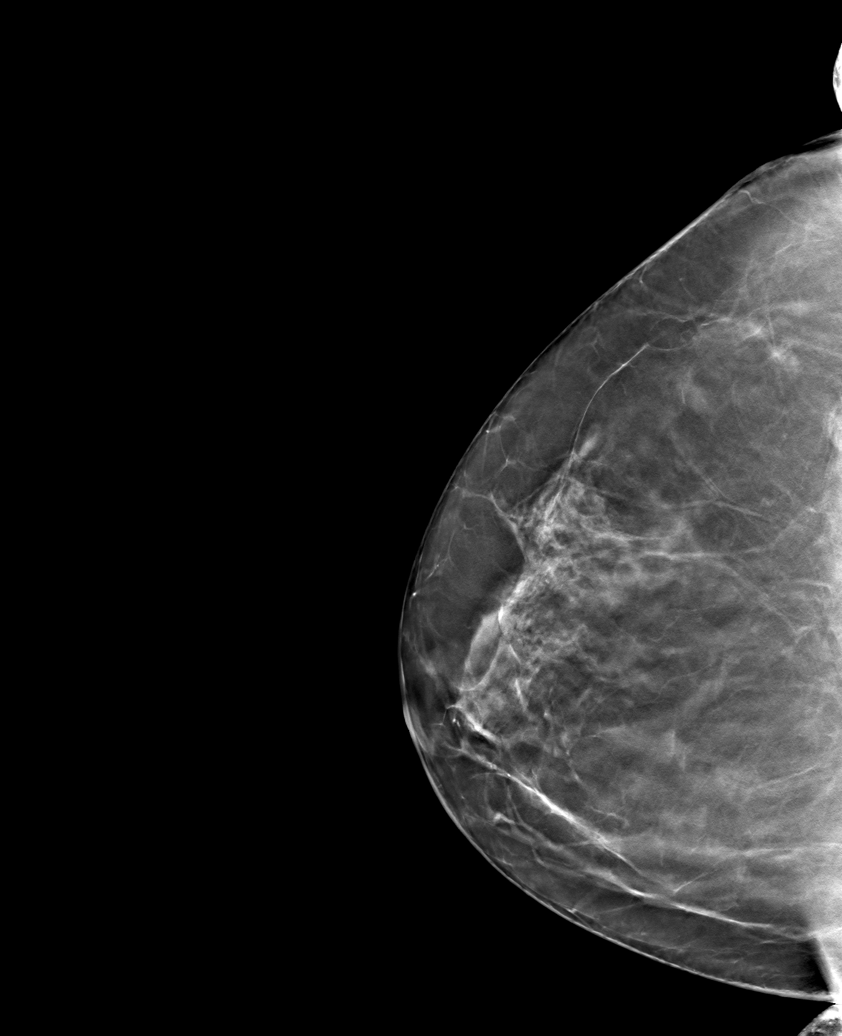

[6 of 30 positions shown; findings below may reference images not displayed]

ACR Breast Density Category b: There are scattered areas of
fibroglandular density.
FINDINGS: There are no findings suspicious for malignancy. The images were
evaluated with computer-aided detection.
IMPRESSION: No mammographic evidence of malignancy. A result letter of this
screening mammogram will be mailed directly to the patient.

RECOMMENDATION:
Screening mammogram in one year. (Code:[id number])

BI-RADS CATEGORY  1: Negative.

## 2022-04-19 DIAGNOSIS — M25561 Pain in right knee: Secondary | ICD-10-CM | POA: Diagnosis not present

## 2022-04-19 DIAGNOSIS — M19072 Primary osteoarthritis, left ankle and foot: Secondary | ICD-10-CM | POA: Diagnosis not present

## 2022-04-19 DIAGNOSIS — M25572 Pain in left ankle and joints of left foot: Secondary | ICD-10-CM | POA: Diagnosis not present

## 2022-04-26 DIAGNOSIS — M1711 Unilateral primary osteoarthritis, right knee: Secondary | ICD-10-CM | POA: Diagnosis not present

## 2022-04-26 DIAGNOSIS — M25561 Pain in right knee: Secondary | ICD-10-CM | POA: Diagnosis not present

## 2022-05-22 ENCOUNTER — Other Ambulatory Visit: Payer: Self-pay | Admitting: Family Medicine

## 2022-05-22 DIAGNOSIS — E041 Nontoxic single thyroid nodule: Secondary | ICD-10-CM | POA: Diagnosis not present

## 2022-05-22 DIAGNOSIS — Z0000 Encounter for general adult medical examination without abnormal findings: Secondary | ICD-10-CM | POA: Diagnosis not present

## 2022-05-22 DIAGNOSIS — M1711 Unilateral primary osteoarthritis, right knee: Secondary | ICD-10-CM | POA: Diagnosis not present

## 2022-05-22 DIAGNOSIS — M8588 Other specified disorders of bone density and structure, other site: Secondary | ICD-10-CM | POA: Diagnosis not present

## 2022-05-22 DIAGNOSIS — M8580 Other specified disorders of bone density and structure, unspecified site: Secondary | ICD-10-CM

## 2022-05-22 DIAGNOSIS — Z23 Encounter for immunization: Secondary | ICD-10-CM | POA: Diagnosis not present

## 2022-05-22 DIAGNOSIS — I10 Essential (primary) hypertension: Secondary | ICD-10-CM | POA: Diagnosis not present

## 2022-05-22 DIAGNOSIS — Z1231 Encounter for screening mammogram for malignant neoplasm of breast: Secondary | ICD-10-CM

## 2022-05-22 DIAGNOSIS — L219 Seborrheic dermatitis, unspecified: Secondary | ICD-10-CM | POA: Diagnosis not present

## 2022-05-22 DIAGNOSIS — F439 Reaction to severe stress, unspecified: Secondary | ICD-10-CM | POA: Diagnosis not present

## 2022-05-30 ENCOUNTER — Other Ambulatory Visit: Payer: Medicare PPO

## 2022-05-31 ENCOUNTER — Ambulatory Visit
Admission: RE | Admit: 2022-05-31 | Discharge: 2022-05-31 | Disposition: A | Discharge status: Home / Self Care | Payer: Medicare PPO | Source: Ambulatory Visit | Attending: Family Medicine | Admitting: Family Medicine

## 2022-05-31 DIAGNOSIS — M8589 Other specified disorders of bone density and structure, multiple sites: Secondary | ICD-10-CM | POA: Diagnosis not present

## 2022-05-31 DIAGNOSIS — M8580 Other specified disorders of bone density and structure, unspecified site: Secondary | ICD-10-CM

## 2022-05-31 DIAGNOSIS — Z780 Asymptomatic menopausal state: Secondary | ICD-10-CM | POA: Diagnosis not present

## 2022-09-12 DIAGNOSIS — H5201 Hypermetropia, right eye: Secondary | ICD-10-CM | POA: Diagnosis not present

## 2022-09-12 DIAGNOSIS — Z961 Presence of intraocular lens: Secondary | ICD-10-CM | POA: Diagnosis not present

## 2022-09-12 DIAGNOSIS — H353132 Nonexudative age-related macular degeneration, bilateral, intermediate dry stage: Secondary | ICD-10-CM | POA: Diagnosis not present

## 2022-11-16 ENCOUNTER — Ambulatory Visit
Admission: RE | Admit: 2022-11-16 | Discharge: 2022-11-16 | Disposition: A | Discharge status: Home / Self Care | Payer: Medicare PPO | Source: Ambulatory Visit | Attending: Family Medicine | Admitting: Family Medicine

## 2022-11-16 DIAGNOSIS — Z1231 Encounter for screening mammogram for malignant neoplasm of breast: Secondary | ICD-10-CM | POA: Diagnosis not present

## 2022-11-20 DIAGNOSIS — M1711 Unilateral primary osteoarthritis, right knee: Secondary | ICD-10-CM | POA: Diagnosis not present

## 2022-11-20 DIAGNOSIS — S90122A Contusion of left lesser toe(s) without damage to nail, initial encounter: Secondary | ICD-10-CM | POA: Diagnosis not present

## 2022-11-20 DIAGNOSIS — F439 Reaction to severe stress, unspecified: Secondary | ICD-10-CM | POA: Diagnosis not present

## 2022-11-20 DIAGNOSIS — I10 Essential (primary) hypertension: Secondary | ICD-10-CM | POA: Diagnosis not present

## 2023-05-30 DIAGNOSIS — M1711 Unilateral primary osteoarthritis, right knee: Secondary | ICD-10-CM | POA: Diagnosis not present

## 2023-06-05 DIAGNOSIS — L219 Seborrheic dermatitis, unspecified: Secondary | ICD-10-CM | POA: Diagnosis not present

## 2023-06-05 DIAGNOSIS — E049 Nontoxic goiter, unspecified: Secondary | ICD-10-CM | POA: Diagnosis not present

## 2023-06-05 DIAGNOSIS — Z0000 Encounter for general adult medical examination without abnormal findings: Secondary | ICD-10-CM | POA: Diagnosis not present

## 2023-06-05 DIAGNOSIS — M8588 Other specified disorders of bone density and structure, other site: Secondary | ICD-10-CM | POA: Diagnosis not present

## 2023-06-05 DIAGNOSIS — I10 Essential (primary) hypertension: Secondary | ICD-10-CM | POA: Diagnosis not present

## 2023-06-05 DIAGNOSIS — Z01818 Encounter for other preprocedural examination: Secondary | ICD-10-CM | POA: Diagnosis not present

## 2023-06-05 DIAGNOSIS — M1711 Unilateral primary osteoarthritis, right knee: Secondary | ICD-10-CM | POA: Diagnosis not present

## 2023-06-05 DIAGNOSIS — Z23 Encounter for immunization: Secondary | ICD-10-CM | POA: Diagnosis not present

## 2023-06-07 ENCOUNTER — Other Ambulatory Visit: Payer: Self-pay | Admitting: Family Medicine

## 2023-06-07 DIAGNOSIS — E041 Nontoxic single thyroid nodule: Secondary | ICD-10-CM

## 2023-06-11 ENCOUNTER — Ambulatory Visit
Admission: RE | Admit: 2023-06-11 | Discharge: 2023-06-11 | Disposition: A | Discharge status: Home / Self Care | Payer: Medicare PPO | Source: Ambulatory Visit | Attending: Family Medicine | Admitting: Family Medicine

## 2023-06-11 DIAGNOSIS — E041 Nontoxic single thyroid nodule: Secondary | ICD-10-CM | POA: Diagnosis not present

## 2023-07-05 DIAGNOSIS — M1711 Unilateral primary osteoarthritis, right knee: Secondary | ICD-10-CM | POA: Diagnosis not present

## 2023-07-10 DIAGNOSIS — M1711 Unilateral primary osteoarthritis, right knee: Secondary | ICD-10-CM | POA: Diagnosis not present

## 2023-07-11 DIAGNOSIS — H04123 Dry eye syndrome of bilateral lacrimal glands: Secondary | ICD-10-CM | POA: Diagnosis not present

## 2023-07-11 DIAGNOSIS — H353132 Nonexudative age-related macular degeneration, bilateral, intermediate dry stage: Secondary | ICD-10-CM | POA: Diagnosis not present

## 2023-07-11 DIAGNOSIS — H52203 Unspecified astigmatism, bilateral: Secondary | ICD-10-CM | POA: Diagnosis not present

## 2023-07-11 DIAGNOSIS — H524 Presbyopia: Secondary | ICD-10-CM | POA: Diagnosis not present

## 2023-07-11 DIAGNOSIS — Z961 Presence of intraocular lens: Secondary | ICD-10-CM | POA: Diagnosis not present

## 2023-07-12 DIAGNOSIS — L821 Other seborrheic keratosis: Secondary | ICD-10-CM | POA: Diagnosis not present

## 2023-07-30 DIAGNOSIS — G8918 Other acute postprocedural pain: Secondary | ICD-10-CM | POA: Diagnosis not present

## 2023-07-30 DIAGNOSIS — M1711 Unilateral primary osteoarthritis, right knee: Secondary | ICD-10-CM | POA: Diagnosis not present

## 2023-07-30 DIAGNOSIS — M25761 Osteophyte, right knee: Secondary | ICD-10-CM | POA: Diagnosis not present

## 2023-08-02 DIAGNOSIS — G8918 Other acute postprocedural pain: Secondary | ICD-10-CM | POA: Diagnosis not present

## 2023-08-02 DIAGNOSIS — M25561 Pain in right knee: Secondary | ICD-10-CM | POA: Diagnosis not present

## 2023-08-07 DIAGNOSIS — G8918 Other acute postprocedural pain: Secondary | ICD-10-CM | POA: Diagnosis not present

## 2023-08-07 DIAGNOSIS — M25561 Pain in right knee: Secondary | ICD-10-CM | POA: Diagnosis not present

## 2023-08-09 DIAGNOSIS — G8918 Other acute postprocedural pain: Secondary | ICD-10-CM | POA: Diagnosis not present

## 2023-08-09 DIAGNOSIS — M25561 Pain in right knee: Secondary | ICD-10-CM | POA: Diagnosis not present

## 2023-08-12 DIAGNOSIS — G8918 Other acute postprocedural pain: Secondary | ICD-10-CM | POA: Diagnosis not present

## 2023-08-12 DIAGNOSIS — M25561 Pain in right knee: Secondary | ICD-10-CM | POA: Diagnosis not present

## 2023-08-14 DIAGNOSIS — M25561 Pain in right knee: Secondary | ICD-10-CM | POA: Diagnosis not present

## 2023-08-14 DIAGNOSIS — G8918 Other acute postprocedural pain: Secondary | ICD-10-CM | POA: Diagnosis not present

## 2023-08-16 DIAGNOSIS — G8918 Other acute postprocedural pain: Secondary | ICD-10-CM | POA: Diagnosis not present

## 2023-08-16 DIAGNOSIS — M25561 Pain in right knee: Secondary | ICD-10-CM | POA: Diagnosis not present

## 2023-08-19 DIAGNOSIS — G8918 Other acute postprocedural pain: Secondary | ICD-10-CM | POA: Diagnosis not present

## 2023-08-19 DIAGNOSIS — M25561 Pain in right knee: Secondary | ICD-10-CM | POA: Diagnosis not present

## 2023-08-22 DIAGNOSIS — G8918 Other acute postprocedural pain: Secondary | ICD-10-CM | POA: Diagnosis not present

## 2023-08-22 DIAGNOSIS — M25561 Pain in right knee: Secondary | ICD-10-CM | POA: Diagnosis not present

## 2023-08-26 DIAGNOSIS — M25561 Pain in right knee: Secondary | ICD-10-CM | POA: Diagnosis not present

## 2023-08-26 DIAGNOSIS — G8918 Other acute postprocedural pain: Secondary | ICD-10-CM | POA: Diagnosis not present

## 2023-08-29 DIAGNOSIS — G8918 Other acute postprocedural pain: Secondary | ICD-10-CM | POA: Diagnosis not present

## 2023-08-29 DIAGNOSIS — M25561 Pain in right knee: Secondary | ICD-10-CM | POA: Diagnosis not present

## 2023-09-02 DIAGNOSIS — M25561 Pain in right knee: Secondary | ICD-10-CM | POA: Diagnosis not present

## 2023-09-02 DIAGNOSIS — G8918 Other acute postprocedural pain: Secondary | ICD-10-CM | POA: Diagnosis not present

## 2023-09-03 DIAGNOSIS — Z5189 Encounter for other specified aftercare: Secondary | ICD-10-CM | POA: Diagnosis not present

## 2023-09-04 DIAGNOSIS — G8918 Other acute postprocedural pain: Secondary | ICD-10-CM | POA: Diagnosis not present

## 2023-09-04 DIAGNOSIS — M25561 Pain in right knee: Secondary | ICD-10-CM | POA: Diagnosis not present

## 2023-09-11 DIAGNOSIS — M25561 Pain in right knee: Secondary | ICD-10-CM | POA: Diagnosis not present

## 2023-09-11 DIAGNOSIS — G8918 Other acute postprocedural pain: Secondary | ICD-10-CM | POA: Diagnosis not present

## 2023-09-13 DIAGNOSIS — M25561 Pain in right knee: Secondary | ICD-10-CM | POA: Diagnosis not present

## 2023-09-13 DIAGNOSIS — G8918 Other acute postprocedural pain: Secondary | ICD-10-CM | POA: Diagnosis not present

## 2023-09-16 DIAGNOSIS — G8918 Other acute postprocedural pain: Secondary | ICD-10-CM | POA: Diagnosis not present

## 2023-09-16 DIAGNOSIS — M25561 Pain in right knee: Secondary | ICD-10-CM | POA: Diagnosis not present

## 2023-09-19 DIAGNOSIS — M25561 Pain in right knee: Secondary | ICD-10-CM | POA: Diagnosis not present

## 2023-09-19 DIAGNOSIS — G8918 Other acute postprocedural pain: Secondary | ICD-10-CM | POA: Diagnosis not present

## 2023-09-24 DIAGNOSIS — G8918 Other acute postprocedural pain: Secondary | ICD-10-CM | POA: Diagnosis not present

## 2023-09-24 DIAGNOSIS — M25561 Pain in right knee: Secondary | ICD-10-CM | POA: Diagnosis not present

## 2023-10-01 DIAGNOSIS — G8918 Other acute postprocedural pain: Secondary | ICD-10-CM | POA: Diagnosis not present

## 2023-10-01 DIAGNOSIS — M25561 Pain in right knee: Secondary | ICD-10-CM | POA: Diagnosis not present

## 2023-10-08 DIAGNOSIS — M25561 Pain in right knee: Secondary | ICD-10-CM | POA: Diagnosis not present

## 2023-10-08 DIAGNOSIS — G8918 Other acute postprocedural pain: Secondary | ICD-10-CM | POA: Diagnosis not present

## 2023-10-29 ENCOUNTER — Other Ambulatory Visit: Payer: Self-pay | Admitting: Family Medicine

## 2023-10-29 DIAGNOSIS — Z1231 Encounter for screening mammogram for malignant neoplasm of breast: Secondary | ICD-10-CM

## 2023-11-22 ENCOUNTER — Ambulatory Visit

## 2023-11-27 ENCOUNTER — Ambulatory Visit

## 2023-12-03 DIAGNOSIS — Z96651 Presence of right artificial knee joint: Secondary | ICD-10-CM | POA: Diagnosis not present

## 2023-12-03 DIAGNOSIS — R634 Abnormal weight loss: Secondary | ICD-10-CM | POA: Diagnosis not present

## 2023-12-03 DIAGNOSIS — I10 Essential (primary) hypertension: Secondary | ICD-10-CM | POA: Diagnosis not present

## 2023-12-11 ENCOUNTER — Ambulatory Visit
Admission: RE | Admit: 2023-12-11 | Discharge: 2023-12-11 | Disposition: A | Discharge status: Home / Self Care | Source: Ambulatory Visit | Attending: Family Medicine | Admitting: Family Medicine

## 2023-12-11 DIAGNOSIS — Z1231 Encounter for screening mammogram for malignant neoplasm of breast: Secondary | ICD-10-CM

## 2023-12-13 DIAGNOSIS — L821 Other seborrheic keratosis: Secondary | ICD-10-CM | POA: Diagnosis not present

## 2024-01-08 ENCOUNTER — Other Ambulatory Visit: Payer: Self-pay

## 2024-01-08 ENCOUNTER — Emergency Department (HOSPITAL_BASED_OUTPATIENT_CLINIC_OR_DEPARTMENT_OTHER)

## 2024-01-08 ENCOUNTER — Emergency Department (HOSPITAL_BASED_OUTPATIENT_CLINIC_OR_DEPARTMENT_OTHER)
Admission: EM | Admit: 2024-01-08 | Discharge: 2024-01-08 | Disposition: A | Discharge status: Home / Self Care | Source: Ambulatory Visit | Attending: Emergency Medicine | Admitting: Emergency Medicine

## 2024-01-08 DIAGNOSIS — R519 Headache, unspecified: Secondary | ICD-10-CM | POA: Insufficient documentation

## 2024-01-08 DIAGNOSIS — H353132 Nonexudative age-related macular degeneration, bilateral, intermediate dry stage: Secondary | ICD-10-CM | POA: Diagnosis not present

## 2024-01-08 DIAGNOSIS — Z961 Presence of intraocular lens: Secondary | ICD-10-CM | POA: Diagnosis not present

## 2024-01-08 DIAGNOSIS — H04123 Dry eye syndrome of bilateral lacrimal glands: Secondary | ICD-10-CM | POA: Diagnosis not present

## 2024-01-08 NOTE — Discharge Instructions (Signed)
 Follow-up with your primary care doctor for further management.  Recommend Tylenol and ibuprofen as needed for headaches.

## 2024-01-08 NOTE — ED Triage Notes (Signed)
 Pt POV ambulatory to triage reporting intermittent headache x1 week, denies nausea or blurred vision. Seen at [facility] clinic and advised to come to ED for head CT.
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
## 2024-02-12 DIAGNOSIS — L820 Inflamed seborrheic keratosis: Secondary | ICD-10-CM | POA: Diagnosis not present
